# Patient Record
Sex: Male | Born: 1965 | Race: White | Hispanic: No | Marital: Married | Smoking: Never smoker
Health system: Southern US, Community
[De-identification: ages and names within clinical notes are randomized; demographics above are authoritative.]

## PROBLEM LIST (undated history)

## (undated) DIAGNOSIS — F410 Panic disorder [episodic paroxysmal anxiety] without agoraphobia: Secondary | ICD-10-CM

## (undated) DIAGNOSIS — F419 Anxiety disorder, unspecified: Secondary | ICD-10-CM

## (undated) HISTORY — PX: HERNIA REPAIR: SHX51

---

## 2012-09-12 ENCOUNTER — Emergency Department (HOSPITAL_COMMUNITY)
Admission: EM | Admit: 2012-09-12 | Discharge: 2012-09-12 | Disposition: A | Discharge status: Home / Self Care | Payer: BC Managed Care – PPO | Attending: Emergency Medicine | Admitting: Emergency Medicine

## 2012-09-12 ENCOUNTER — Emergency Department (HOSPITAL_COMMUNITY): Payer: BC Managed Care – PPO

## 2012-09-12 ENCOUNTER — Encounter (HOSPITAL_COMMUNITY): Payer: Self-pay | Admitting: Emergency Medicine

## 2012-09-12 DIAGNOSIS — R062 Wheezing: Secondary | ICD-10-CM | POA: Insufficient documentation

## 2012-09-12 DIAGNOSIS — R0602 Shortness of breath: Secondary | ICD-10-CM | POA: Insufficient documentation

## 2012-09-12 DIAGNOSIS — M549 Dorsalgia, unspecified: Secondary | ICD-10-CM | POA: Insufficient documentation

## 2012-09-12 DIAGNOSIS — R079 Chest pain, unspecified: Secondary | ICD-10-CM

## 2012-09-12 DIAGNOSIS — R072 Precordial pain: Secondary | ICD-10-CM | POA: Insufficient documentation

## 2012-09-12 DIAGNOSIS — R059 Cough, unspecified: Secondary | ICD-10-CM | POA: Insufficient documentation

## 2012-09-12 DIAGNOSIS — F410 Panic disorder [episodic paroxysmal anxiety] without agoraphobia: Secondary | ICD-10-CM | POA: Insufficient documentation

## 2012-09-12 DIAGNOSIS — R012 Other cardiac sounds: Secondary | ICD-10-CM | POA: Insufficient documentation

## 2012-09-12 DIAGNOSIS — M79609 Pain in unspecified limb: Secondary | ICD-10-CM | POA: Insufficient documentation

## 2012-09-12 DIAGNOSIS — Z79899 Other long term (current) drug therapy: Secondary | ICD-10-CM | POA: Insufficient documentation

## 2012-09-12 DIAGNOSIS — R05 Cough: Secondary | ICD-10-CM | POA: Insufficient documentation

## 2012-09-12 DIAGNOSIS — R42 Dizziness and giddiness: Secondary | ICD-10-CM | POA: Insufficient documentation

## 2012-09-12 DIAGNOSIS — R12 Heartburn: Secondary | ICD-10-CM | POA: Insufficient documentation

## 2012-09-12 DIAGNOSIS — F419 Anxiety disorder, unspecified: Secondary | ICD-10-CM

## 2012-09-12 HISTORY — DX: Panic disorder (episodic paroxysmal anxiety): F41.0

## 2012-09-12 HISTORY — DX: Anxiety disorder, unspecified: F41.9

## 2012-09-12 LAB — HEPATIC FUNCTION PANEL
ALT: 12 U/L (ref 0–53)
AST: 21 U/L (ref 0–37)
Albumin: 3.2 g/dL — ABNORMAL LOW (ref 3.5–5.2)
Alkaline Phosphatase: 40 U/L (ref 39–117)
Bilirubin, Direct: <0.1 mg/dL (ref 0.0–0.3)
Total Bilirubin: 0.4 mg/dL (ref 0.3–1.2)
Total Protein: 6.6 g/dL (ref 6.0–8.3)

## 2012-09-12 LAB — BASIC METABOLIC PANEL
CO2: 28 mEq/L (ref 19–32)
Chloride: 102 mEq/L (ref 96–112)
Creatinine, Ser: 1.22 mg/dL (ref 0.50–1.35)
Glucose, Bld: 97 mg/dL (ref 70–99)

## 2012-09-12 LAB — CBC
HCT: 50.5 % (ref 39.0–52.0)
Hemoglobin: 17.1 g/dL — ABNORMAL HIGH (ref 13.0–17.0)
MCH: 28.6 pg (ref 26.0–34.0)
MCHC: 33.9 g/dL (ref 30.0–36.0)
MCV: 84.4 fL (ref 78.0–100.0)
Platelets: 229 10*3/uL (ref 150–400)
RBC: 5.98 MIL/uL — ABNORMAL HIGH (ref 4.22–5.81)
RDW: 14.2 % (ref 11.5–15.5)
WBC: 11 10*3/uL — ABNORMAL HIGH (ref 4.0–10.5)

## 2012-09-12 LAB — BASIC METABOLIC PANEL WITH GFR
BUN: 11 mg/dL (ref 6–23)
Calcium: 8.6 mg/dL (ref 8.4–10.5)
GFR calc Af Amer: 80 mL/min — ABNORMAL LOW (ref >90)
GFR calc non Af Amer: 69 mL/min — ABNORMAL LOW (ref >90)
Potassium: 3.6 meq/L (ref 3.5–5.1)
Sodium: 137 meq/L (ref 135–145)

## 2012-09-12 LAB — POCT I-STAT TROPONIN I
Troponin i, poc: 0.02 ng/mL (ref 0.00–0.08)
Troponin i, poc: 0 ng/mL (ref 0.00–0.08)

## 2012-09-12 LAB — LIPASE, BLOOD: Lipase: 61 U/L — ABNORMAL HIGH (ref 11–59)

## 2012-09-12 NOTE — ED Notes (Signed)
Pt from home reports sudden onset of CP with SOB radiating to back, L arm pain. Pt adds that he was nauseous, dizzy and lightheaded. Pt denies any hx of this before, but confirms that he has hx of panic attacks. Pt is A&O and in NAD. Wife at bedside

## 2012-09-12 NOTE — Discharge Instructions (Signed)
 Chest Pain (Nonspecific) It is often hard to give a specific diagnosis for the cause of chest pain. There is always a chance that your pain could be related to something serious, such as a heart attack or a blood clot in the lungs. You need to follow up with your caregiver for further evaluation. CAUSES   Heartburn.  Pneumonia or bronchitis.  Anxiety or stress.  Inflammation around your heart (pericarditis) or lung (pleuritis or pleurisy).  A blood clot in the lung.  A collapsed lung (pneumothorax). It can develop suddenly on its own (spontaneous pneumothorax) or from injury (trauma) to the chest.  Shingles infection (herpes zoster virus). The chest wall is composed of bones, muscles, and cartilage. Any of these can be the source of the pain.  The bones can be bruised by injury.  The muscles or cartilage can be strained by coughing or overwork.  The cartilage can be affected by inflammation and become sore (costochondritis). DIAGNOSIS  Lab tests or other studies, such as X-rays, electrocardiography, stress testing, or cardiac imaging, may be needed to find the cause of your pain.  TREATMENT   Treatment depends on what may be causing your chest pain. Treatment may include:  Acid blockers for heartburn.  Anti-inflammatory medicine.  Pain medicine for inflammatory conditions.  Antibiotics if an infection is present.  You may be advised to change lifestyle habits. This includes stopping smoking and avoiding alcohol, caffeine, and chocolate.  You may be advised to keep your head raised (elevated) when sleeping. This reduces the chance of acid going backward from your stomach into your esophagus.  Most of the time, nonspecific chest pain will improve within 2 to 3 days with rest and mild pain medicine. HOME CARE INSTRUCTIONS   If antibiotics were prescribed, take your antibiotics as directed. Finish them even if you start to feel better.  For the next few days, avoid physical  activities that bring on chest pain. Continue physical activities as directed.  Do not smoke.  Avoid drinking alcohol.  Only take over-the-counter or prescription medicine for pain, discomfort, or fever as directed by your caregiver.  Follow your caregiver's suggestions for further testing if your chest pain does not go away.  Keep any follow-up appointments you made. If you do not go to an appointment, you could develop lasting (chronic) problems with pain. If there is any problem keeping an appointment, you must call to reschedule. SEEK MEDICAL CARE IF:   You think you are having problems from the medicine you are taking. Read your medicine instructions carefully.  Your chest pain does not go away, even after treatment.  You develop a rash with blisters on your chest. SEEK IMMEDIATE MEDICAL CARE IF:   You have increased chest pain or pain that spreads to your arm, neck, jaw, back, or abdomen.  You develop shortness of breath, an increasing cough, or you are coughing up blood.  You have severe back or abdominal pain, feel nauseous, or vomit.  You develop severe weakness, fainting, or chills.  You have a fever. THIS IS AN EMERGENCY. Do not wait to see if the pain will go away. Get medical help at once. Call your local emergency services (911 in [place] Do not drive yourself to the hospital. MAKE SURE YOU:   Understand these instructions.  Will watch your condition.  Will get help right away if you are not doing well or get worse. Document Released: 10/09/2004 Document Revised: 03/24/2011 Document Reviewed: 08/05/2007 [facility] Patient Information 2014 [employer].    Please contact your hometown primary care physician, discuss your symptoms and your ED visit today.  Consider further evaluation by a cardiologist if indicated.

## 2012-09-12 NOTE — ED Provider Notes (Addendum)
CSN: [id number]     Arrival date & time 09/12/12  0705 History   First MD Initiated Contact with Patient 09/12/12 0720     Chief Complaint  Patient presents with  . Chest Pain   (Consider location/radiation/quality/duration/timing/severity/associated sxs/prior Treatment) HPI Comments: Pt has had some pain along left forearm and left hand including dorsum of thumb, more of a numbness like its asleep sensation over the past few days.  Pt is a weight lifter and reports no significant symptoms of CP, nausea, diaphoresis while weight lifting different from what he is used to while weight lifting.  He denies any significant stressors recently but has had more frequent panic attack episodes described as getting flushed, sweaty, back tightness and discomfort and thus began taking zoloft again fairly recently after 1 year of being off of it.    Patient is a 47 y.o. male presenting with chest pain. The history is provided by the patient and the spouse.  Chest Pain Pain location:  Substernal area Pain quality: aching and pressure   Pain quality: not sharp, not stabbing and not tearing   Pain radiates to:  Upper back (between shoulder blades) Pain radiates to the back: yes   Pain severity:  Moderate Onset quality:  Gradual Duration:  1 hour Timing:  Constant Progression:  Partially resolved Chronicity:  New Context: no movement, no stress and no trauma   Context comment:  Pt had woken up mildly nauseated, went to use bathroom and symptoms began very shortly after waking Relieved by:  Rest Worsened by:  Nothing tried Ineffective treatments:  None tried Associated symptoms: anxiety, back pain, cough, dizziness, heartburn and shortness of breath   Associated symptoms: no diaphoresis, no numbness and no palpitations   Risk factors: male sex   Risk factors: no aortic disease, no coronary artery disease, no diabetes mellitus, no high cholesterol, no hypertension, not obese and no smoking     Past  Medical History  Diagnosis Date  . Anxiety   . Panic attacks    Past Surgical History  Procedure Laterality Date  . Hernia repair     History reviewed. No pertinent family history. History  Substance Use Topics  . Smoking status: Never Smoker   . Smokeless tobacco: Not on file  . Alcohol Use: No    Review of Systems  Constitutional: Negative for diaphoresis.  Respiratory: Positive for cough and shortness of breath.   Cardiovascular: Positive for chest pain. Negative for palpitations.  Gastrointestinal: Positive for heartburn.  Musculoskeletal: Positive for back pain.  Neurological: Positive for dizziness. Negative for numbness.  All other systems reviewed and are negative.    Allergies  Ibuprofen and Bactrim  Home Medications   Current Outpatient Rx  Name  Route  Sig  Dispense  Refill  . sertraline (ZOLOFT) 100 MG tablet   Oral   Take 100 mg by mouth daily.          BP 127/79  Pulse 74  Temp(Src) 97.5 F (36.4 C) (Oral)  Resp 10  Ht 5\' 6"  (1.676 m)  Wt 216 lb (97.977 kg)  BMI 34.88 kg/m2  SpO2 96% Physical Exam  Nursing note and vitals reviewed. Constitutional: He is oriented to person, place, and time. He appears well-developed and well-nourished. No distress.  HENT:  Head: Normocephalic and atraumatic.  Eyes: Conjunctivae and EOM are normal.  Neck: No JVD present.  Cardiovascular: Normal rate, regular rhythm and intact distal pulses.  Exam reveals distant heart sounds.   Pulmonary/Chest:  Effort normal. No accessory muscle usage. No respiratory distress. He has no decreased breath sounds. He has wheezes in the left lower field. He has no rhonchi. He has no rales.  Abdominal: Soft. He exhibits no distension. There is no tenderness. There is no rebound and no guarding.  Musculoskeletal: Normal range of motion. He exhibits no edema.  Neurological: He is alert and oriented to person, place, and time. He exhibits normal muscle tone. Coordination normal.   Skin: Skin is warm and dry. No rash noted. He is not diaphoretic.  Psychiatric: He has a normal mood and affect.    ED Course  Procedures (including critical care time) Labs Review Labs Reviewed  CBC - Abnormal; Notable for the following:    WBC 11.0 (*)    RBC 5.98 (*)    Hemoglobin 17.1 (*)    All other components within normal limits  BASIC METABOLIC PANEL - Abnormal; Notable for the following:    GFR calc non Af Amer 69 (*)    GFR calc Af Amer 80 (*)    All other components within normal limits  HEPATIC FUNCTION PANEL - Abnormal; Notable for the following:    Albumin 3.2 (*)    All other components within normal limits  LIPASE, BLOOD - Abnormal; Notable for the following:    Lipase 61 (*)    All other components within normal limits  POCT I-STAT TROPONIN I  POCT I-STAT TROPONIN I   Imaging Review Dg Chest 2 View  09/12/2012   *RADIOLOGY REPORT*  Clinical Data: Chest pain  CHEST - 2 VIEW  Comparison: 02/10/2012  Findings: The heart and pulmonary vascularity are within normal limits.  The lungs are clear bilaterally.  No acute bony abnormality is noted.  IMPRESSION: No acute abnormalities seen.   Original Report Authenticated By: Alcide Clever, M.D.    RA sat is 96% and I interpret to be adequate  ECG at time 0716 shows SR at rate 76, LAD, normal intervals, non specific borderline intraventricular delay, no ST or T wave abns.  Borderline ECG.  No priors to compare.    11:04 AM Pt feels normal, no CP at all.  Serial troponins are negative and reassuring.  Pt adn family counseled, told to follow up with PCP and consider outpt stress test.  I informed of the slightly elevated lipase, I think non specific.    MDM   1. Chest pain at rest   2. Anxiety      Pt with h/o panic and anxiety presents with symptoms with features of both panic and some additional symptoms.  Symptoms are resolving at present.  No other risk factors.  Pt weight lifts and has not had prior anginal  symptoms it seems.  Thus doubt this is cardiac.  Will check troponin, LFT's, lipase, CXR.  Pt encouraged to follow up with PCP for a cardiology referral with hometown PCP in [place].        Gavin Pound. Oletta Lamas, MD 09/12/12 1105  Gavin Pound. Shady Bradish, MD 09/12/12 1105

## 2014-04-07 ENCOUNTER — Emergency Department (HOSPITAL_COMMUNITY)
Admission: EM | Admit: 2014-04-07 | Discharge: 2014-04-07 | Disposition: A | Discharge status: Home / Self Care | Payer: BC Managed Care – PPO | Attending: Emergency Medicine | Admitting: Emergency Medicine

## 2014-04-07 ENCOUNTER — Encounter (HOSPITAL_COMMUNITY): Payer: Self-pay | Admitting: Emergency Medicine

## 2014-04-07 DIAGNOSIS — F419 Anxiety disorder, unspecified: Secondary | ICD-10-CM | POA: Insufficient documentation

## 2014-04-07 DIAGNOSIS — L03115 Cellulitis of right lower limb: Secondary | ICD-10-CM | POA: Diagnosis not present

## 2014-04-07 DIAGNOSIS — Z79899 Other long term (current) drug therapy: Secondary | ICD-10-CM | POA: Insufficient documentation

## 2014-04-07 LAB — CBC WITH DIFFERENTIAL/PLATELET
BASOS ABS: 0.1 10*3/uL (ref 0.0–0.1)
BASOS PCT: 1 % (ref 0–1)
EOS ABS: 0.1 10*3/uL (ref 0.0–0.7)
Eosinophils Relative: 1 % (ref 0–5)
HCT: 47.4 % (ref 39.0–52.0)
Hemoglobin: 16.3 g/dL (ref 13.0–17.0)
LYMPHS PCT: 25 % (ref 12–46)
Lymphs Abs: 1.9 10*3/uL (ref 0.7–4.0)
MCH: 29.3 pg (ref 26.0–34.0)
MCHC: 34.4 g/dL (ref 30.0–36.0)
MCV: 85.1 fL (ref 78.0–100.0)
MONO ABS: 0.8 10*3/uL (ref 0.1–1.0)
Monocytes Relative: 10 % (ref 3–12)
NEUTROS PCT: 63 % (ref 43–77)
Neutro Abs: 4.6 10*3/uL (ref 1.7–7.7)
PLATELETS: ADEQUATE 10*3/uL (ref 150–400)
RBC: 5.57 MIL/uL (ref 4.22–5.81)
RDW: 13.9 % (ref 11.5–15.5)
WBC: 7.5 10*3/uL (ref 4.0–10.5)

## 2014-04-07 LAB — BASIC METABOLIC PANEL
Anion gap: 8 (ref 5–15)
BUN: 13 mg/dL (ref 6–23)
CO2: 24 mmol/L (ref 19–32)
Calcium: 8.7 mg/dL (ref 8.4–10.5)
Chloride: 104 mmol/L (ref 96–112)
Creatinine, Ser: 1.28 mg/dL (ref 0.50–1.35)
GFR calc Af Amer: 75 mL/min — ABNORMAL LOW (ref >90)
GFR, EST NON AFRICAN AMERICAN: 65 mL/min — AB (ref >90)
GLUCOSE: 103 mg/dL — AB (ref 70–99)
Potassium: 4.4 mmol/L (ref 3.5–5.1)
SODIUM: 136 mmol/L (ref 135–145)

## 2014-04-07 NOTE — ED Provider Notes (Signed)
CSN: [id number]     Arrival date & time 04/07/14  1449 History   First MD Initiated Contact with Patient 04/07/14 1522     Chief Complaint  Patient presents with  . Cellulitis     (Consider location/radiation/quality/duration/timing/severity/associated sxs/prior Treatment) HPI Peter CreeJohn Knapp is a 49 year old male with no significant past medical history who presents the ER complaining cellulitis. Patient reports noting an abscess on his right upper thigh several days ago. He states he picked at the abscess with a needle and noted white colored drainage from it. Patient states he went to his PCP yesterday when he noticed redness around the abscess. Patient's PCP placed patient on doxycycline, patient states he began taking doxycycline approximately 24 hours ago. Patient has noticed increased in the redness and swelling since. Patient states he is unable to follow up with his PCP due to the fact that his office is closed today. Patient denies any fever, nausea, vomiting, weakness.   Past Medical History  Diagnosis Date  . Anxiety   . Panic attacks    Past Surgical History  Procedure Laterality Date  . Hernia repair     No family history on file. History  Substance Use Topics  . Smoking status: Never Smoker   . Smokeless tobacco: Not on file  . Alcohol Use: No    Review of Systems  Constitutional: Negative for fever.  HENT: Negative for trouble swallowing.   Eyes: Negative for visual disturbance.  Respiratory: Negative for shortness of breath.   Cardiovascular: Negative for chest pain.  Gastrointestinal: Negative for nausea, vomiting and abdominal pain.  Genitourinary: Negative for dysuria.  Musculoskeletal: Negative for neck pain.  Skin: Positive for rash.  Neurological: Negative for dizziness, weakness and numbness.  Psychiatric/Behavioral: Negative.       Allergies  Ibuprofen and Bactrim  Home Medications   Prior to Admission medications   Medication Sig Start Date  End Date Taking? Authorizing Provider  doxycycline (VIBRAMYCIN) 100 MG capsule Take 100 mg by mouth every 12 (twelve) hours.   Yes Historical Provider, MD  sertraline (ZOLOFT) 50 MG tablet Take 50 mg by mouth daily.   Yes Historical Provider, MD   BP 132/66 mmHg  Pulse 82  Temp(Src) 98.2 F (36.8 C) (Oral)  Resp 16  Wt 218 lb (98.884 kg)  SpO2 97% Physical Exam  Constitutional: He is oriented to person, place, and time. He appears well-developed and well-nourished. No distress.  HENT:  Head: Normocephalic and atraumatic.  Mouth/Throat: Oropharynx is clear and moist. No oropharyngeal exudate.  Eyes: Right eye exhibits no discharge. Left eye exhibits no discharge. No scleral icterus.  Neck: Normal range of motion.  Cardiovascular: Normal rate, regular rhythm and normal heart sounds.   No murmur heard. Pulmonary/Chest: Effort normal and breath sounds normal. No respiratory distress.  Abdominal: Soft. There is no tenderness.  Musculoskeletal: Normal range of motion. He exhibits no edema or tenderness.  Neurological: He is alert and oriented to person, place, and time. No cranial nerve deficit. Coordination normal.  Skin: Skin is warm and dry. No rash noted. He is not diaphoretic.  Psychiatric: He has a normal mood and affect.  Nursing note and vitals reviewed.   ED Course  Procedures (including critical care time) Labs Review Labs Reviewed  BASIC METABOLIC PANEL - Abnormal; Notable for the following:    Glucose, Bld 103 (*)    GFR calc non Af Amer 65 (*)    GFR calc Af Amer 75 (*)    All  other components within normal limits  CBC WITH DIFFERENTIAL/PLATELET    Imaging Review No results found.   EKG Interpretation None      MDM   Final diagnoses:  Cellulitis of right lower extremity    Patient here with cellulitis to right upper thigh. No obvious drainable abscess. Patient seen and PCP yesterday, placed on doxycycline. Patient not yet filled outpatient antibiotics as  he has noticed spreading of his redness, however has only been on doxycycline for 24 hours. Patient has no systemic signs or symptoms. No concern for sepsis or SIRS. Patient afebrile, non-tachycardic, nontachypneic, non-hypoxic, well-appearing and in no acute distress. Patient's labs reassuring today. No streaking around area of cellulitis. No concern for sepsis or SIRS. Patient afebrile, non-tachycardic, nontachypneic, non-hypoxic, well-appearing and in no acute distress. No leukocytosis. Patient discharged home, strongly encouraged to return to the ER 24 hours for reevaluation of his wound to further assess efficacy eval patient therapy. Discussed return precautions with patient, patient verbalizes understanding and agreement of this plan. I encouraged patient to call or return to the ER with any questions or concerns.  BP 132/66 mmHg  Pulse 82  Temp(Src) 98.2 F (36.8 C) (Oral)  Resp 16  Wt 218 lb (98.884 kg)  SpO2 97%  Signed,  Ladona Mow, PA-C 1:16 AM  Patient discussed with Dr. Carmell Austria, MD  Ladona Mow, PA-C 04/08/14 1610  Benjiman Core, MD 04/09/14 0005

## 2014-04-07 NOTE — ED Notes (Signed)
Pt from home c/o abcess to right upper thigh. Seen at PCP yesterday and given doxycycline. Reddened area worse and has moved outside of lines marked yesterday. Denies fevers at home.

## 2014-04-07 NOTE — Discharge Instructions (Signed)

## 2014-04-07 NOTE — Progress Notes (Signed)
Pt states pcp is Dr Sol Passerough EPIC updated

## 2016-04-06 ENCOUNTER — Emergency Department (HOSPITAL_COMMUNITY): Payer: BC Managed Care – PPO

## 2016-04-06 ENCOUNTER — Emergency Department (HOSPITAL_COMMUNITY)
Admission: EM | Admit: 2016-04-06 | Discharge: 2016-04-06 | Disposition: A | Discharge status: Home / Self Care | Payer: BC Managed Care – PPO | Attending: Emergency Medicine | Admitting: Emergency Medicine

## 2016-04-06 ENCOUNTER — Encounter (HOSPITAL_COMMUNITY): Payer: Self-pay | Admitting: Oncology

## 2016-04-06 DIAGNOSIS — R1084 Generalized abdominal pain: Secondary | ICD-10-CM | POA: Insufficient documentation

## 2016-04-06 DIAGNOSIS — Z79899 Other long term (current) drug therapy: Secondary | ICD-10-CM | POA: Insufficient documentation

## 2016-04-06 DIAGNOSIS — R072 Precordial pain: Secondary | ICD-10-CM | POA: Diagnosis not present

## 2016-04-06 DIAGNOSIS — R0789 Other chest pain: Secondary | ICD-10-CM | POA: Diagnosis present

## 2016-04-06 DIAGNOSIS — R140 Abdominal distension (gaseous): Secondary | ICD-10-CM

## 2016-04-06 LAB — CBC
HCT: 41.3 % (ref 39.0–52.0)
Hemoglobin: 14.3 g/dL (ref 13.0–17.0)
MCH: 29.7 pg (ref 26.0–34.0)
MCHC: 34.6 g/dL (ref 30.0–36.0)
MCV: 85.7 fL (ref 78.0–100.0)
Platelets: 167 10*3/uL (ref 150–400)
RBC: 4.82 MIL/uL (ref 4.22–5.81)
RDW: 13.8 % (ref 11.5–15.5)
WBC: 9 10*3/uL (ref 4.0–10.5)

## 2016-04-06 LAB — BASIC METABOLIC PANEL
Anion gap: 6 (ref 5–15)
BUN: 13 mg/dL (ref 6–20)
CO2: 27 mmol/L (ref 22–32)
CREATININE: 1.09 mg/dL (ref 0.61–1.24)
Calcium: 8.7 mg/dL — ABNORMAL LOW (ref 8.9–10.3)
Chloride: 106 mmol/L (ref 101–111)
GFR calc Af Amer: >60 mL/min (ref >60)
Glucose, Bld: 106 mg/dL — ABNORMAL HIGH (ref 65–99)
POTASSIUM: 3.5 mmol/L (ref 3.5–5.1)
SODIUM: 139 mmol/L (ref 135–145)

## 2016-04-06 LAB — URINALYSIS, ROUTINE W REFLEX MICROSCOPIC
Bilirubin Urine: NEGATIVE
GLUCOSE, UA: NEGATIVE mg/dL
HGB URINE DIPSTICK: NEGATIVE
Ketones, ur: NEGATIVE mg/dL
Leukocytes, UA: NEGATIVE
Nitrite: NEGATIVE
PH: 6 (ref 5.0–8.0)
Protein, ur: NEGATIVE mg/dL
SPECIFIC GRAVITY, URINE: 1.021 (ref 1.005–1.030)

## 2016-04-06 LAB — I-STAT TROPONIN, ED: TROPONIN I, POC: 0 ng/mL (ref 0.00–0.08)

## 2016-04-06 MED ORDER — IOPAMIDOL (ISOVUE-300) INJECTION 61%
100.0000 mL | Freq: Once | INTRAVENOUS | Status: AC | PRN
  Administered 2016-04-06: 100 mL via INTRAVENOUS

## 2016-04-06 MED ORDER — IOPAMIDOL (ISOVUE-300) INJECTION 61%
INTRAVENOUS | Status: AC
  Filled 2016-04-06: qty 100

## 2016-04-06 NOTE — ED Triage Notes (Signed)
Pt c/o central CP that radiates to left chest since yesterday.  +dizziness, +lightheaded.  Pt also c/o abdominal distension.  Rates pain 5/10, aching in nature.

## 2016-04-06 NOTE — ED Notes (Signed)
Patient transported to CT 

## 2016-04-06 NOTE — ED Provider Notes (Signed)
[facility]-EMERGENCY DEPT Provider Note   CSN: [id number] Arrival date & time: 04/06/16  [id number]     History   Chief Complaint Chief Complaint  Patient presents with  . Chest Pain    HPI Peter Knapp is a 51 y.o. male.  HPI Patient presents to the emergency department with increasing abdominal distention over the past 4-5 days.  He was recently on a ten-day course of prednisone.  He does report that he a lot during that time.  He denies nausea vomiting.  He feels examining normal bowel movements.  No prior history of cirrhosis.  He just feels bloated and swollen his abdomen.  He presents tonight because of the ongoing abdominal swelling and distention and develop new burning chest discomfort with associated dizziness and lightheadedness.  No preceding palpitations.  No prior history of cardiac disease.  No radiation of his chest discomfort.  He reports no chest discomfort at this time.  No associate shortness of breath.  Denies nausea vomiting.  Symptoms are mild in severity   Past Medical History:  Diagnosis Date  . Anxiety   . Panic attacks     There are no active problems to display for this patient.   Past Surgical History:  Procedure Laterality Date  . HERNIA REPAIR         Home Medications    Prior to Admission medications   Medication Sig Start Date End Date Taking? Authorizing Provider  Omega-3 Fatty Acids (FISH OIL OMEGA-3 PO) Take 2 tablets by mouth daily. Unsure of strength   Yes Historical Provider, MD  Red Yeast Rice Extract (RED YEAST RICE PO) Take 2 tablets by mouth daily. Unsure of strength   Yes Historical Provider, MD  sertraline (ZOLOFT) 100 MG tablet Take 100 mg by mouth daily. 05/18/15  Yes Historical Provider, MD  albuterol (PROVENTIL) (2.5 MG/3ML) 0.083% nebulizer solution Take 2.5 mg by nebulization every 4 (four) hours as needed for wheezing. 03/20/16   Historical Provider, MD  budesonide (PULMICORT) 0.25 MG/2ML nebulizer solution Take 0.25 mg by  nebulization 2 (two) times daily. 03/24/16   Historical Provider, MD  doxycycline (VIBRAMYCIN) 100 MG capsule Take 100 mg by mouth every 12 (twelve) hours.    Historical Provider, MD  ipratropium-albuterol (DUONEB) 0.5-2.5 (3) MG/3ML SOLN Take 3 mLs by nebulization every 6 (six) hours as needed for wheezing. 03/27/16   Historical Provider, MD  predniSONE (DELTASONE) 50 MG tablet Take 50 mg by mouth daily. 03/29/16   Historical Provider, MD  sertraline (ZOLOFT) 50 MG tablet Take 50 mg by mouth daily.    Historical Provider, MD    Family History History reviewed. No pertinent family history.  Social History Social History  Substance Use Topics  . Smoking status: Never Smoker  . Smokeless tobacco: Never Used  . Alcohol use No     Allergies   Ibuprofen and Bactrim [sulfamethoxazole-trimethoprim]   Review of Systems Review of Systems  All other systems reviewed and are negative.    Physical Exam Updated Vital Signs BP 136/64   Pulse 69   Temp 98.2 F (36.8 C) (Oral)   Resp 12   Ht 5\' 7"  (1.702 m)   Wt 212 lb (96.2 kg)   SpO2 97%   BMI 33.20 kg/m   Physical Exam  Constitutional: He is oriented to person, place, and time. He appears well-developed and well-nourished.  HENT:  Head: Normocephalic and atraumatic.  Eyes: EOM are normal.  Neck: Normal range of motion.  Cardiovascular: Normal rate and  regular rhythm.   Pulmonary/Chest: Effort normal and breath sounds normal. No respiratory distress.  Abdominal: Soft. There is no tenderness.  Abdomen is bloated and distended but not tight  Musculoskeletal: Normal range of motion.  Neurological: He is alert and oriented to person, place, and time.  Skin: Skin is warm and dry.  Psychiatric: He has a normal mood and affect. Judgment normal.  Nursing note and vitals reviewed.    ED Treatments / Results  Labs (all labs ordered are listed, but only abnormal results are displayed) Labs Reviewed  BASIC METABOLIC PANEL -  Abnormal; Notable for the following:       Result Value   Glucose, Bld 106 (*)    Calcium 8.7 (*)    All other components within normal limits  CBC  URINALYSIS, ROUTINE W REFLEX MICROSCOPIC  I-STAT TROPOININ, ED    EKG  EKG Interpretation  Date/Time:  Sunday April 06 2016 05:37:45 EDT Ventricular Rate:  77 PR Interval:    QRS Duration: 114 QT Interval:  416 QTC Calculation: 471 R Axis:   8 Text Interpretation:  Sinus rhythm Borderline intraventricular conduction delay No significant change was found Confirmed by Jamason Peckham  MD, Caryn Bee ([id number]) on 04/06/2016 6:29:42 AM Also confirmed by Patria Mane  MD, Caryn Bee ([id number]), editor Misty Stanley [phone])  on 04/06/2016 7:36:14 AM       Radiology Dg Chest 2 View  Result Date: 04/06/2016 CLINICAL DATA:  51 year old male with chest pain. EXAM: CHEST  2 VIEW COMPARISON:  Chest radiograph dated 09/12/2012 FINDINGS: The heart size and mediastinal contours are within normal limits. Both lungs are clear. The visualized skeletal structures are unremarkable. IMPRESSION: No active cardiopulmonary disease. Electronically Signed   By: Elgie Collard M.D.   On: 04/06/2016 06:33   Ct Abdomen Pelvis W Contrast  Result Date: 04/06/2016 CLINICAL DATA:  Abdominal distention and lower chest pain EXAM: CT ABDOMEN AND PELVIS WITH CONTRAST TECHNIQUE: Multidetector CT imaging of the abdomen and pelvis was performed using the standard protocol following bolus administration of intravenous contrast. CONTRAST:  ISOVUE-300 IOPAMIDOL (ISOVUE-300) INJECTION 61% COMPARISON:  February 20, 2012 FINDINGS: Lower chest: Lung bases are clear. Hepatobiliary: No focal liver lesions are apparent. Gallbladder is absent. There is no biliary duct dilatation. Pancreas: No pancreatic mass or inflammatory focus. Spleen: No splenic lesions apparent. Adrenals/Urinary Tract: Adrenals appear normal bilaterally. Kidneys bilaterally show no evidence of mass or hydronephrosis on either side.  There is no renal or ureteral calculus on either side. Urinary bladder is midline with wall thickness within normal limits. Stomach/Bowel: There is no bowel wall or mesenteric thickening. There is no evident bowel obstruction. No free air or portal venous air. Vascular/Lymphatic: There is atherosclerotic calcification in the aorta with a slight amount of atherosclerotic plaque in the distal aorta peripherally. There is no abdominal aortic aneurysm. Major mesenteric vessels appear patent. No adenopathy appreciable in the abdomen or pelvis. Reproductive: Prostate and seminal vesicles are normal in size and contour. There are a few small prostatic calculi. No pelvic mass evident. Other: Appendix appears normal. No ascites or abscess evident in the abdomen or pelvis. Musculoskeletal: There are no blastic or lytic bone lesions. There is degenerative change in the lower lumbar spine. No intramuscular or abdominal wall lesions. IMPRESSION: No bowel obstruction or bowel wall thickening. No abscess. Appendix appears normal. No renal or ureteral calculus. No hydronephrosis. There are several small prostatic calculi. There is aortic atherosclerosis. Electronically Signed   By: Bretta Bang III M.D.  On: 04/06/2016 08:00    Procedures Procedures (including critical care time)  Medications Ordered in ED Medications  iopamidol (ISOVUE-300) 61 % injection (not administered)  iopamidol (ISOVUE-300) 61 % injection 100 mL (100 mLs Intravenous Contrast Given 04/06/16 0740)     Initial Impression / Assessment and Plan / ED Course  I have reviewed the triage vital signs and the nursing notes.  Pertinent labs & imaging results that were available during my care of the patient were reviewed by me and considered in my medical decision making (see chart for details).     Patient is overall well-appearing.  Doubt ACS.  Doubt PE.  In regards to his CT scans CT scans without abnormality.  I suspect that he is somewhat  bloated from the prednisone use last week.  He is no longer on prednisone.  I suspect he will continue to improve.  Primary care follow-up.  No indication for additional workup or admission to the hospital at this time.  Patient understands to return to the ER for new or worsening symptoms  Final Clinical Impressions(s) / ED Diagnoses   Final diagnoses:  Precordial pain  Generalized abdominal pain  Abdominal distension    New Prescriptions Discharge Medication List as of 04/06/2016  8:17 AM       Azalia Bilis, MD 04/07/16 [phone]

## 2022-10-06 ENCOUNTER — Emergency Department (HOSPITAL_BASED_OUTPATIENT_CLINIC_OR_DEPARTMENT_OTHER)
Admission: EM | Admit: 2022-10-06 | Discharge: 2022-10-06 | Disposition: A | Discharge status: Home / Self Care | Payer: BC Managed Care – PPO | Attending: Emergency Medicine | Admitting: Emergency Medicine

## 2022-10-06 ENCOUNTER — Other Ambulatory Visit: Payer: Self-pay

## 2022-10-06 ENCOUNTER — Encounter (HOSPITAL_BASED_OUTPATIENT_CLINIC_OR_DEPARTMENT_OTHER): Payer: Self-pay

## 2022-10-06 DIAGNOSIS — Z1152 Encounter for screening for COVID-19: Secondary | ICD-10-CM | POA: Diagnosis not present

## 2022-10-06 DIAGNOSIS — H4311 Vitreous hemorrhage, right eye: Secondary | ICD-10-CM

## 2022-10-06 DIAGNOSIS — H538 Other visual disturbances: Secondary | ICD-10-CM | POA: Diagnosis present

## 2022-10-06 LAB — RESP PANEL BY RT-PCR (RSV, FLU A&B, COVID)  RVPGX2
Influenza A by PCR: NEGATIVE
Influenza B by PCR: NEGATIVE
Resp Syncytial Virus by PCR: NEGATIVE
SARS Coronavirus 2 by RT PCR: NEGATIVE

## 2022-10-06 LAB — GROUP A STREP BY PCR: Group A Strep by PCR: NOT DETECTED

## 2022-10-06 MED ORDER — FLUORESCEIN SODIUM 1 MG OP STRP
1.0000 | ORAL_STRIP | Freq: Once | OPHTHALMIC | Status: AC
  Administered 2022-10-06: 1 via OPHTHALMIC
  Filled 2022-10-06: qty 1

## 2022-10-06 MED ORDER — TETRACAINE HCL 0.5 % OP SOLN
2.0000 [drp] | Freq: Once | OPHTHALMIC | Status: AC
  Administered 2022-10-06: 2 [drp] via OPHTHALMIC
  Filled 2022-10-06: qty 4

## 2022-10-06 NOTE — ED Triage Notes (Signed)
Patient reports going to his eye doctor a few weeks ago and given eye drops. At the time he was having flashes of light in his right arm. 4 days ago he noticed a film floating around his right eye. Today he noticed a big black line in his right eye. Reports dull headache on the right side. No sensitivity to light. Also reports scratchy throat for a few days.

## 2022-10-06 NOTE — Discharge Instructions (Addendum)
You have have a vitreous hemorrhage.  The retinal specialist will call you tomorrow for an appointment tomorrow   Return to ER if you have worse blurry vision

## 2022-10-06 NOTE — Consult Note (Signed)
Chief Complaint/Reason for Consultation: floaters OD  HPI: 57 yo M present with 3 week hx of floaters OD. Was seen by Dr. Precious Bard, OD in [place]. He was given Bromfenac eye drops as well. He notes the floaters worsened this last Friday (3 days ago) with larger floaters and a "black line" over the central vision. He denies any peripheral visual loss or curtain over the vision. He notes photopsias and overall hazy vision OD. Denies hx of trauma or diabetes. He wears glasses, but no significant ocular history otherwise.    ROS: otherwise as in HPI   There are no problems to display for this patient.  No current facility-administered medications on file prior to encounter.   Current Outpatient Medications on File Prior to Encounter  Medication Sig Dispense Refill   albuterol (PROVENTIL) (2.5 MG/3ML) 0.083% nebulizer solution Take 2.5 mg by nebulization every 4 (four) hours as needed for wheezing.     budesonide (PULMICORT) 0.25 MG/2ML nebulizer solution Take 0.25 mg by nebulization 2 (two) times daily.     doxycycline (VIBRAMYCIN) 100 MG capsule Take 100 mg by mouth every 12 (twelve) hours.     ipratropium-albuterol (DUONEB) 0.5-2.5 (3) MG/3ML SOLN Take 3 mLs by nebulization every 6 (six) hours as needed for wheezing.     Omega-3 Fatty Acids (FISH OIL OMEGA-3 PO) Take 2 tablets by mouth daily. Unsure of strength     predniSONE (DELTASONE) 50 MG tablet Take 50 mg by mouth daily.     Red Yeast Rice Extract (RED YEAST RICE PO) Take 2 tablets by mouth daily. Unsure of strength     sertraline (ZOLOFT) 100 MG tablet Take 100 mg by mouth daily.     sertraline (ZOLOFT) 50 MG tablet Take 50 mg by mouth daily.     Allergies  Allergen Reactions   Ibuprofen Anaphylaxis   Bactrim [Sulfamethoxazole-Trimethoprim] Rash    EXAMINATION  VAcc (near): OD: 20/30  , PHNI OS: 20/20    Pupils:  OD: Equal, round, reactive, no APD OS: Equal, round, reactive, no APD  T(Pen): OD: 12   mm Hg OS:  15  mm  Hg  CVF: full to finger counting OU EOM: full OU, no limitation or restriction OU  Slit lamp Exam: Ext/Lids: 2+ MGD, +collarettes OU, mild telangiectasias OU Conj/Sclera: white and quiet OU Cornea: clear OU, no abrasion or infiltrate OU AC: Deep and Quiet OU Iris: Round and Flat OU Lens: Clear OU  Dilated OU with phenylephrine and tropicamide OU 10:28 pm  Dilated Fundus Exam: Vitreous: OD PVD with mild vitreous hemorrhage OS clear Disc: sharp and pink with 0.25 c/d OU Macula: flat and dry OU Vessels: normal distribution OU, perfused OU Periphery: OD inferior area of intraretinal hemorrhages and ?horse-shoe retinal tear, OS flat and attached 360 without breaks or tears  Exam performed in ED @ Bedside, scleral depressor not available/not performed   Imp/Plan:  Retinal Tear OD  new onset PVD with mild vitreous hemorrhage and likely horse-shoe tear inferiorly, though exam slightly limited in ED without scleral depression. Recommend patient be seen by retina specialist tomorrow for evaluation and possible laser retinopexy. Our office will call first thing in the morning to arrange. Avoid blood thinners/NSAIDS/strenuous activity. Mild Vitreous hemorrhage OD - as above Posterior vitreous detachment OD - as above    Shon Millet, M.D. Ophthalmology [facility]

## 2022-10-06 NOTE — ED Provider Notes (Cosign Needed Addendum)
[place] [facility] Provider Note   CSN: [id number] Arrival date & time: 10/06/22  8299     History  Chief Complaint  Patient presents with   Eye Problem    Peter Knapp is a 57 y.o. male.  Pt reports he has had multiple floaters in his eyes in the past.  Pt was seen by optometrist 3 weeks ago and one week ago and was given eye drops.  Pt reports today he saw a dark line across his vision from his right eye. Pt reports his vision is now cloudy.  Pt denies any eye injury.  No diabetes.  Pt reports dark line has resolved. Pt denies any fever or chills  no uri symptoms   The history is provided by the patient.  Eye Problem Location:  Right eye Quality:  Aching Severity:  Moderate Onset quality:  Sudden Duration:  1 day Timing:  Constant Progression:  Worsening Relieved by:  Nothing Worsened by:  Nothing Associated symptoms: no photophobia and no redness        Home Medications Prior to Admission medications   Medication Sig Start Date End Date Taking? Authorizing Provider  albuterol (PROVENTIL) (2.5 MG/3ML) 0.083% nebulizer solution Take 2.5 mg by nebulization every 4 (four) hours as needed for wheezing. 03/20/16   [provider]  budesonide (PULMICORT) 0.25 MG/2ML nebulizer solution Take 0.25 mg by nebulization 2 (two) times daily. 03/24/16   [provider]  doxycycline (VIBRAMYCIN) 100 MG capsule Take 100 mg by mouth every 12 (twelve) hours.    [provider]  ipratropium-albuterol (DUONEB) 0.5-2.5 (3) MG/3ML SOLN Take 3 mLs by nebulization every 6 (six) hours as needed for wheezing. 03/27/16   [provider]  Omega-3 Fatty Acids (FISH OIL OMEGA-3 PO) Take 2 tablets by mouth daily. Unsure of strength    [provider]  predniSONE (DELTASONE) 50 MG tablet Take 50 mg by mouth daily. 03/29/16   [provider]  Red Yeast Rice Extract (RED YEAST RICE PO) Take 2 tablets by mouth daily.  Unsure of strength    [provider]  sertraline (ZOLOFT) 100 MG tablet Take 100 mg by mouth daily. 05/18/15   [provider]  sertraline (ZOLOFT) 50 MG tablet Take 50 mg by mouth daily.    [provider]      Allergies    Ibuprofen and Bactrim [sulfamethoxazole-trimethoprim]    Review of Systems   Review of Systems  Eyes:  Positive for visual disturbance. Negative for photophobia, pain and redness.  All other systems reviewed and are negative.   Physical Exam Updated Vital Signs BP 116/86 (BP Location: Left Arm)   Pulse 64   Temp 98.1 F (36.7 C) (Oral)   Resp 14   Ht 5\' 6"  (1.676 m)   Wt 87.1 kg   SpO2 98%   BMI 30.99 kg/m  Physical Exam Vitals reviewed.  Constitutional:      Appearance: Normal appearance.  HENT:     Head: Normocephalic and atraumatic.  Eyes:     Extraocular Movements: Extraocular movements intact.     Pupils: Pupils are equal, round, and reactive to light.  Skin:    General: Skin is warm.  Neurological:     General: No focal deficit present.     Mental Status: He is alert.  Psychiatric:        Mood and Affect: Mood normal.     ED Results / Procedures / Treatments  Labs (all labs ordered are listed, but only abnormal results are displayed) Labs Reviewed  RESP PANEL BY RT-PCR (RSV, FLU A&B, COVID)  RVPGX2  GROUP A STREP BY PCR    EKG None  Radiology No results found.  Procedures Procedures    Medications Ordered in ED Medications  tetracaine (PONTOCAINE) 0.5 % ophthalmic solution 2 drop (2 drops Right Eye Given 10/06/22 2102)  fluorescein ophthalmic strip 1 strip (1 strip Right Eye Given 10/06/22 2102)    ED Course/ Medical Decision Making/ A&P                                 Medical Decision Making Pt complains of blurred vision in his right eye.  Pt states he saw a dark line across his eye earlier today.  Pt reports dark line has resolved   Amount and/or Complexity of Data Reviewed Independent  Historian: spouse    Details: Pt is here with his wife who is supportive  Discussion of management or test interpretation with external provider(s): I discussed the pt with Dr. Sherrine Maples Ophthalmology who will see pt here.    Risk Prescription drug management. Risk Details: Dr. Silverio Lay in to see and examine.  He performed an ultrasound, images in chart               Final Clinical Impression(s) / ED Diagnoses Final diagnoses:  Vision blurring    Rx / DC Orders ED Discharge Orders     None      Pt's care turned over to Dr. Silverio Lay  Dr. Sherrine Maples evaluating   Elson Areas, PA-C 10/06/22 2205    Elson Areas, PA-C 10/06/22 2235    Charlynne Pander, MD 10/07/22 1500

## 2023-03-01 ENCOUNTER — Emergency Department (HOSPITAL_BASED_OUTPATIENT_CLINIC_OR_DEPARTMENT_OTHER): Payer: Self-pay

## 2023-03-01 ENCOUNTER — Encounter (HOSPITAL_BASED_OUTPATIENT_CLINIC_OR_DEPARTMENT_OTHER): Payer: Self-pay | Admitting: Urology

## 2023-03-01 DIAGNOSIS — M25462 Effusion, left knee: Secondary | ICD-10-CM | POA: Diagnosis not present

## 2023-03-01 DIAGNOSIS — M25562 Pain in left knee: Secondary | ICD-10-CM | POA: Diagnosis present

## 2023-03-01 NOTE — ED Triage Notes (Signed)
 Left knee injury while at the gym yesterday  Previous injury per pt  Swelling noted and pain with ambulation

## 2023-03-02 ENCOUNTER — Ambulatory Visit: Payer: 59 | Admitting: Family Medicine

## 2023-03-02 ENCOUNTER — Other Ambulatory Visit: Payer: Self-pay

## 2023-03-02 ENCOUNTER — Emergency Department (HOSPITAL_BASED_OUTPATIENT_CLINIC_OR_DEPARTMENT_OTHER)
Admission: EM | Admit: 2023-03-02 | Discharge: 2023-03-02 | Disposition: A | Discharge status: Home / Self Care | Payer: Self-pay | Attending: Emergency Medicine | Admitting: Emergency Medicine

## 2023-03-02 VITALS — BP 136/86 | Ht 66.0 in | Wt 205.0 lb

## 2023-03-02 DIAGNOSIS — M25462 Effusion, left knee: Secondary | ICD-10-CM | POA: Diagnosis not present

## 2023-03-02 MED ORDER — METHYLPREDNISOLONE SODIUM SUCC 125 MG IJ SOLR
125.0000 mg | Freq: Once | INTRAMUSCULAR | Status: AC
  Administered 2023-03-02: 125 mg via INTRAMUSCULAR
  Filled 2023-03-02: qty 2

## 2023-03-02 MED ORDER — PREDNISONE 20 MG PO TABS: ORAL_TABLET | ORAL | 0 refills | Status: DC

## 2023-03-02 NOTE — Progress Notes (Unsigned)
 CHIEF COMPLAINT: No chief complaint on file.  _____________________________________________________________ SUBJECTIVE  HPI  Pt is a 58 y.o. male here for evaluation of  Ongoing for *** Inciting event: Primarily located   Radiating Numbness/tingling Catching/locking *** Exacerbated by Therapies tried so far: Pain is not bad, but yesterday pain was pretty bad  Prednisone    Works as Sport***  58 year old male who presents ER today secondary to left knee pain. Patient states that he did squats already in the gym. No obvious injuries. When he got home he noted his left knee was significantly swollen. States this has happened before and has had to have it drained and put steroids in. Sees a orthopedic doctor here in [facility] somewhere. No pain or swelling elsewhere. No other associated symptoms.   ------------------------------------------------------------------------------------------------------ Past Medical History:  Diagnosis Date   Anxiety    Panic attacks     Past Surgical History:  Procedure Laterality Date   HERNIA REPAIR        Outpatient Encounter Medications as of 03/02/2023  Medication Sig   Omega-3 Fatty Acids (FISH OIL OMEGA-3 PO) Take 2 tablets by mouth daily. Unsure of strength   predniSONE (DELTASONE) 20 MG tablet 3 tabs po daily x 3 days, then 2 tabs x 3 days, then 1.5 tabs x 3 days, then 1 tab x 3 days, then 0.5 tabs x 3 days   Red Yeast Rice Extract (RED YEAST RICE PO) Take 2 tablets by mouth daily. Unsure of strength   sertraline (ZOLOFT) 100 MG tablet Take 100 mg by mouth daily.   sertraline (ZOLOFT) 50 MG tablet Take 50 mg by mouth daily.   No facility-administered encounter medications on file as of 03/02/2023.    ------------------------------------------------------------------------------------------------------  _____________________________________________________________ OBJECTIVE  PHYSICAL EXAM  Today's Vitals   03/02/23 1412   BP: 136/86  Weight: 205 lb (93 kg)  Height: 5\' 6"  (1.676 m)   Body mass index is 33.09 kg/m.   reviewed  General: A+Ox3, no acute distress, well-nourished, appropriate affect CV: pulses 2+ regular, nondiaphoretic, no peripheral edema, cap refill <2sec Lungs: no audible wheezing, non-labored breathing, bilateral chest rise/fall, nontachypneic Skin: warm, well-perfused, non-icteric, no susp lesions or rashes Neuro:  Sensation intact, muscle tone wnl, no atrophy Psych: no signs of depression or anxiety MSK: *** Knee: No swelling or deformity Neg fluid wave, joint milking ROM Flex {Numbers; 0-100:15068}, Ext {NUMBERS; -10-45 JOINT ROM:10287} NTTP over the quad tendon, medial fem condyle, lat fem condyle, patella, plica, patella tendon, tibial tuberostiy, fibular head, posterior fossa, pes anserine bursa, gerdy's tubercle, medial jt line, lateral jt line Neg anterior and posterior drawer Neg lachman Neg sag sign Negative varus stress Negative valgus stress Negative McMurray Negative Thessaly  Gait {GAIT:23234}       _____________________________________________________________ ASSESSMENT/PLAN Diagnoses and all orders for this visit:  Effusion of left knee -     [place] LIMITED JOINT SPACE STRUCTURES LOW LEFT; Future -     MR Knee Left  Wo Contrast; Future    Atraumatic knee effusion Aspi/csi    Electronically signed by: Burna Forts, MD 03/02/2023 2:49 PM

## 2023-03-02 NOTE — ED Provider Notes (Signed)
 [place] [facility] Provider Note   CSN: [id number] Arrival date & time: 03/01/23  1955     History  Chief Complaint  Patient presents with   Knee Injury    Thaddus Mcdowell is a 58 y.o. male.  58 year old male who presents ER today secondary to left knee pain.  Patient states that he did squats already in the gym.  No obvious injuries.  When he got home he noted his left knee was significantly swollen.  States this has happened before and has had to have it drained and put steroids in.  Sees a orthopedic doctor here in [facility] somewhere.  No pain or swelling elsewhere.  No other associated symptoms.        Home Medications Prior to Admission medications   Medication Sig Start Date End Date Taking? Authorizing Provider  predniSONE (DELTASONE) 20 MG tablet 3 tabs po daily x 3 days, then 2 tabs x 3 days, then 1.5 tabs x 3 days, then 1 tab x 3 days, then 0.5 tabs x 3 days 03/02/23  Yes Clotine Heiner, Barbara Cower, MD  Omega-3 Fatty Acids (FISH OIL OMEGA-3 PO) Take 2 tablets by mouth daily. Unsure of strength    [provider]  Red Yeast Rice Extract (RED YEAST RICE PO) Take 2 tablets by mouth daily. Unsure of strength    [provider]  sertraline (ZOLOFT) 100 MG tablet Take 100 mg by mouth daily. 05/18/15   [provider]  sertraline (ZOLOFT) 50 MG tablet Take 50 mg by mouth daily.    [provider]      Allergies    Ibuprofen and Bactrim [sulfamethoxazole-trimethoprim]    Review of Systems   Review of Systems  Physical Exam Updated Vital Signs BP 133/74 (BP Location: Left Arm)   Pulse (!) 56   Temp 98 F (36.7 C) (Oral)   Resp 20   Ht 5\' 6"  (1.676 m)   Wt 87 kg   SpO2 98%   BMI 30.96 kg/m  Physical Exam Vitals and nursing note reviewed.  Constitutional:      Appearance: He is well-developed.  HENT:     Head: Normocephalic and atraumatic.  Cardiovascular:     Rate and Rhythm: Normal rate.  Pulmonary:      Effort: Pulmonary effort is normal. No respiratory distress.  Abdominal:     General: There is no distension.  Musculoskeletal:        General: Swelling (Significant edema of left knee and effusion.  Blottable.) present. Normal range of motion.     Cervical back: Normal range of motion.  Neurological:     General: No focal deficit present.     Mental Status: He is alert.     ED Results / Procedures / Treatments   Labs (all labs ordered are listed, but only abnormal results are displayed) Labs Reviewed - No data to display  EKG None  Radiology DG Knee Complete 4 Views Left Result Date: 03/01/2023 CLINICAL DATA:  Left knee injury at the gym. EXAM: LEFT KNEE - COMPLETE 4+ VIEW COMPARISON:  None Available. FINDINGS: No evidence of fracture or joint dislocation. There is suggestion of a moderate knee joint effusion. Minimal patellofemoral compartment osteoarthritis. Soft tissues are unremarkable. IMPRESSION: Suggestion of a moderate knee joint effusion. No acute fracture or dislocation. Electronically Signed   By: Romona Curls M.D.   On: 03/01/2023 21:08    Procedures Procedures    Medications Ordered in ED Medications  methylPREDNISolone sodium succinate (SOLU-MEDROL) 125 mg/2 mL injection 125 mg (125 mg Intramuscular Given 03/02/23 0227)    ED Course/ Medical Decision Making/ A&P                                 Medical Decision Making Amount and/or Complexity of Data Reviewed Radiology: ordered.  Risk Prescription drug management.   Knee effusion. Will give systemic steroids as he has an allergy to NSAIDs. Use compression, ice, elevation and rest for the next couple days. Fu w/ ortho or sports medicine for evaluation, consider MRI. Low suspicion for septic arthritis, bout or other etiology requiring ER imaging, hospitalization or arthrocentesis at this time.    Final Clinical Impression(s) / ED Diagnoses Final diagnoses:  Effusion of left knee    Rx / DC  Orders ED Discharge Orders          Ordered    predniSONE (DELTASONE) 20 MG tablet        03/02/23 0207              Bronwen Pendergraft, Barbara Cower, MD 03/02/23 [id number]

## 2023-03-04 ENCOUNTER — Ambulatory Visit (HOSPITAL_BASED_OUTPATIENT_CLINIC_OR_DEPARTMENT_OTHER)
Admission: RE | Admit: 2023-03-04 | Discharge: 2023-03-04 | Disposition: A | Discharge status: Home / Self Care | Payer: 59 | Source: Ambulatory Visit | Attending: Family Medicine | Admitting: Family Medicine

## 2023-03-04 DIAGNOSIS — M25462 Effusion, left knee: Secondary | ICD-10-CM | POA: Diagnosis present

## 2023-03-04 LAB — BORRELIA SPECIES DNA, REAL-TIME PCR, WITH REFLEXES, SYNOVIALF: BORRELIA SPP DNA, REAL TIME PCR, SF/CSF: NOT DETECTED

## 2023-03-05 ENCOUNTER — Other Ambulatory Visit: Payer: Self-pay | Admitting: Family Medicine

## 2023-03-05 DIAGNOSIS — M25462 Effusion, left knee: Secondary | ICD-10-CM

## 2023-03-05 NOTE — Progress Notes (Signed)
 Patient contacted, MRI results reviewed. Synovial fluid studies remain pending, borrelia neg. All questions answered. Pt interested in ortho referral for surgical opinion due to prolonged chronicity and need for multiple joint aspirations/CSI. Return precautions discussed. Patient verbalized understanding and is in agreement with plan. Patient to contact clinic if he has not heard from orthopedics for a scheduled visit by early-mid week.

## 2023-03-08 LAB — ANAEROBIC AND AEROBIC CULTURE
AER RESULT:: NO GROWTH
MICRO NUMBER:: 16092610
MICRO NUMBER:: 16092611
SPECIMEN QUALITY:: ADEQUATE
SPECIMEN QUALITY:: ADEQUATE

## 2023-03-08 LAB — SYNOVIAL FLUID ANALYSIS, COMPLETE
Basophils, %: 0 %
Eosinophils-Synovial: 0 % (ref 0–2)
Lymphocytes-Synovial Fld: 1 % (ref 0–74)
Monocyte/Macrophage: 62 % (ref 0–69)
Neutrophil, Synovial: 37 % — ABNORMAL HIGH (ref 0–24)
Synoviocytes, %: 0 % (ref 0–15)
WBC, Synovial: 1280 {cells}/uL — ABNORMAL HIGH (ref <150)

## 2023-03-09 ENCOUNTER — Ambulatory Visit: Payer: 59 | Admitting: Physician Assistant

## 2023-03-09 ENCOUNTER — Encounter: Payer: Self-pay | Admitting: Physician Assistant

## 2023-03-09 VITALS — Ht 66.0 in | Wt 200.0 lb

## 2023-03-09 DIAGNOSIS — M25462 Effusion, left knee: Secondary | ICD-10-CM | POA: Insufficient documentation

## 2023-03-09 NOTE — Progress Notes (Signed)
 Office Visit Note   Patient: Peter Knapp           Date of Birth: 07-May-1965           MRN: [id number] Visit Date: 03/09/2023              Requested by: Burna Forts, MD [address] [place],  [place] [address] PCP: Leane Call, PA-C   Assessment & Plan: Visit Diagnoses:  1. Effusion, left knee     Plan: Patient is a pleasant 58 year old gentleman who is very active and enjoys weight training.  He has had a 3-year history of intermittent left knee swelling and effusions.  Denies any particular injury but noticed that can happen after he has been doing a lot of deep squats at the gym.  He has had 3 recent effusions.  Fluid aspirated has been sent for evaluation did not show any significant issues no infection no gout.  Otherwise she really does not have much pain in the knee except when he gets fluid.  I reviewed he had did have an MRI done and I reviewed this with Dr. August Saucer.  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  We recommended trying modifying his some of his workout not to be doing deep squats or bending his knee past 90 degrees.  His wife asked about gel injections not sure this would be helpful but certainly he could try it.  From a surgical aspect could consider an arthroscopy microfracture procedure though success would not be as predictable.  He is going to try modifying his activity will follow-up with Dr. August Saucer  Follow-Up Instructions: No follow-ups on file.   Orders:  No orders of the defined types were placed in this encounter.  No orders of the defined types were placed in this encounter.     Procedures: No procedures performed   Clinical Data: No additional findings.   Subjective: Chief Complaint  Patient presents with   Left Knee - Pain    HPI pleasant 58 year old  gentleman with a chief complaint of left knee recurrent swelling.  Denies any injuries.  Notices this after he has been working out at [employer]  Especially when he has been doing a lot of deep squats.  This causes effusion and pain he has had 3  Review of Systems  All other systems reviewed and are negative.    Objective: Vital Signs: Ht 5\' 6"  (1.676 m)   Wt 200 lb (90.7 kg)   BMI 32.28 kg/m   Physical Exam Constitutional:      Appearance: Normal appearance.  Pulmonary:     Effort: Pulmonary effort is normal.  Skin:    General: Skin is warm and dry.  Neurological:     General: No focal deficit present.     Mental Status: He is alert and oriented to person, place, and time.  Psychiatric:        Mood and Affect: Mood normal.        Behavior: Behavior normal.     Ortho Exam Left knee no redness no erythema he does have a mild to moderate effusion compartments are soft and nontender he is neurovascular intact no tenderness over the medial lateral joint line good endpoint on anterior draw Specialty Comments:  No specialty comments available.  Imaging: No  results found.   PMFS History: Patient Active Problem List   Diagnosis Date Noted   Effusion, left knee 03/09/2023   Past Medical History:  Diagnosis Date   Anxiety    Panic attacks     History reviewed. No pertinent family history.  Past Surgical History:  Procedure Laterality Date   HERNIA REPAIR     Social History   Occupational History   Not on file  Tobacco Use   Smoking status: Never   Smokeless tobacco: Never  Substance and Sexual Activity   Alcohol use: No   Drug use: No   Sexual activity: Not on file

## 2023-06-27 ENCOUNTER — Ambulatory Visit (HOSPITAL_BASED_OUTPATIENT_CLINIC_OR_DEPARTMENT_OTHER): Admission: EM | Admit: 2023-06-27 | Discharge: 2023-06-27 | Disposition: A | Discharge status: Home / Self Care

## 2023-06-27 ENCOUNTER — Encounter (HOSPITAL_BASED_OUTPATIENT_CLINIC_OR_DEPARTMENT_OTHER): Payer: Self-pay | Admitting: Emergency Medicine

## 2023-06-27 DIAGNOSIS — M5432 Sciatica, left side: Secondary | ICD-10-CM

## 2023-06-27 MED ORDER — PREDNISONE 10 MG (21) PO TBPK: ORAL_TABLET | ORAL | 0 refills | Status: AC

## 2023-06-27 MED ORDER — TRIAMCINOLONE ACETONIDE 40 MG/ML IJ SUSP
40.0000 mg | Freq: Once | INTRAMUSCULAR | Status: AC
  Administered 2023-06-27: 40 mg via INTRAMUSCULAR

## 2023-06-27 MED ORDER — TIZANIDINE HCL 4 MG PO TABS: 4.0000 mg | ORAL_TABLET | Freq: Four times a day (QID) | ORAL | 0 refills | Status: AC | PRN

## 2023-06-27 NOTE — Discharge Instructions (Addendum)
 We have given you a steroid injection here today.  You can start the prednisone  pack in the morning.  Take with food You can take Tylenol also as needed for extra pain relief. Muscle relaxer provided you can take this in the evenings before bed to help if needed. Recommend alternating heat and ice to the area and gentle stretching. Follow-up with your doctor for any continued issues Information given your packet about sciatic nerve and rehab

## 2023-06-27 NOTE — ED Provider Notes (Signed)
 [facility]    CSN: [id number] Arrival date & time: 06/27/23  1445      History   Chief Complaint No chief complaint on file.   HPI Peter Knapp is a 58 y.o. male.   Patient is a 58 year old male who presents today with left lower back pain radiating down the left leg.  This started Thursday but getting worse.  Has had similar issue in the past with sciatic nerve pain.  This episode started after doing some heavy leg presses.  The pain has progressed.  He has been taken over-the-counter medications without much relief.  Denies any numbness, tingling or weakness in extremities.  Denies any loss of bowel or bladder.4     Past Medical History:  Diagnosis Date   Anxiety    Panic attacks     Patient Active Problem List   Diagnosis Date Noted   Effusion, left knee 03/09/2023    Past Surgical History:  Procedure Laterality Date   HERNIA REPAIR         Home Medications    Prior to Admission medications   Medication Sig Start Date End Date Taking? Authorizing Provider  omeprazole (PRILOSEC) 40 MG capsule Take 1 capsule by mouth daily. 08/29/21  Yes [provider]  predniSONE  (STERAPRED UNI-PAK 21 TAB) 10 MG (21) TBPK tablet Take as dosed on pack 06/27/23  Yes Maysa Lynn A, FNP  sertraline (ZOLOFT) 100 MG tablet Take 100 mg by mouth daily. 05/18/15  Yes [provider]  tamsulosin (FLOMAX) 0.4 MG CAPS capsule  06/23/23  Yes [provider]  tiZANidine (ZANAFLEX) 4 MG tablet Take 1 tablet (4 mg total) by mouth every 6 (six) hours as needed for muscle spasms. 06/27/23  Yes Namish Krise A, FNP  Omega-3 Fatty Acids (FISH OIL OMEGA-3 PO) Take 2 tablets by mouth daily. Unsure of strength    [provider]  Red Yeast Rice Extract (RED YEAST RICE PO) Take 2 tablets by mouth daily. Unsure of strength    [provider]  sertraline (ZOLOFT) 50 MG tablet Take 50 mg by mouth daily.    [provider]    Family  History History reviewed. No pertinent family history.  Social History Social History   Tobacco Use   Smoking status: Never   Smokeless tobacco: Never  Substance Use Topics   Alcohol use: No   Drug use: No     Allergies   Ibuprofen and Bactrim [sulfamethoxazole-trimethoprim]   Review of Systems Review of Systems See HPI  Physical Exam Triage Vital Signs ED Triage Vitals  Encounter Vitals Group     BP 06/27/23 1454 130/76     Girls Systolic BP Percentile --      Girls Diastolic BP Percentile --      Boys Systolic BP Percentile --      Boys Diastolic BP Percentile --      Pulse Rate 06/27/23 1454 97     Resp 06/27/23 1454 18     Temp 06/27/23 1454 98.1 F (36.7 C)     Temp Source 06/27/23 1454 Oral     SpO2 06/27/23 1454 95 %     Weight --      Height --      Head Circumference --      Peak Flow --      Pain Score 06/27/23 1452 9     Pain Loc --      Pain Education --  Exclude from Growth Chart --    No data found.  Updated Vital Signs BP 130/76 (BP Location: Right Arm)   Pulse 97   Temp 98.1 F (36.7 C) (Oral)   Resp 18   SpO2 95%   Visual Acuity Right Eye Distance:   Left Eye Distance:   Bilateral Distance:    Right Eye Near:   Left Eye Near:    Bilateral Near:     Physical Exam Constitutional:      Appearance: Normal appearance.  Pulmonary:     Effort: Pulmonary effort is normal.   Musculoskeletal:     Lumbar back: Positive left straight leg raise test.       Back:   Neurological:     Mental Status: He is alert.   Psychiatric:        Mood and Affect: Mood normal.      UC Treatments / Results  Labs (all labs ordered are listed, but only abnormal results are displayed) Labs Reviewed - No data to display  EKG   Radiology No results found.  Procedures Procedures (including critical care time)  Medications Ordered in UC Medications  triamcinolone acetonide (KENALOG-40) injection 40 mg (40 mg Intramuscular Given  06/27/23 1511)    Initial Impression / Assessment and Plan / UC Course  I have reviewed the triage vital signs and the nursing notes.  Pertinent labs & imaging results that were available during my care of the patient were reviewed by me and considered in my medical decision making (see chart for details).     Left-sided nerve pain-steroid injection given here in clinic today.  Start prednisone  pack in the morning with food.  Tylenol as needed for extra pain relief.  Muscle relaxer provided to use before bedtime if needed.  Recommend to alternate heat and ice and gentle stretching to the area. Follow-up with PCP for any continued issues.  Information given in packet about sciatic nerve and rehab. Final Clinical Impressions(s) / UC Diagnoses   Final diagnoses:  Sciatic nerve pain, left     Discharge Instructions      We have given you a steroid injection here today.  You can start the prednisone  pack in the morning.  Take with food You can take Tylenol also as needed for extra pain relief. Muscle relaxer provided you can take this in the evenings before bed to help if needed. Recommend alternating heat and ice to the area and gentle stretching. Follow-up with your doctor for any continued issues Information given your packet about sciatic nerve and rehab     ED Prescriptions     Medication Sig Dispense Auth. Provider   predniSONE  (STERAPRED UNI-PAK 21 TAB) 10 MG (21) TBPK tablet Take as dosed on pack 1 each Kelven Flater A, FNP   tiZANidine (ZANAFLEX) 4 MG tablet Take 1 tablet (4 mg total) by mouth every 6 (six) hours as needed for muscle spasms. 30 tablet Landa Pine, FNP      PDMP not reviewed this encounter.   Landa Pine, FNP 06/29/23 [phone]

## 2023-06-27 NOTE — ED Triage Notes (Signed)
 Pt c/o sciatica pain from lower back down his left leg started Thursday but is getting worse. Pt has had the issue in the past.

## 2023-12-14 ENCOUNTER — Other Ambulatory Visit: Payer: Self-pay | Admitting: Urology

## 2023-12-14 DIAGNOSIS — E291 Testicular hypofunction: Secondary | ICD-10-CM

## 2023-12-15 ENCOUNTER — Encounter: Payer: Self-pay | Admitting: Urology

## 2024-01-21 ENCOUNTER — Other Ambulatory Visit

## 2024-02-04 ENCOUNTER — Ambulatory Visit
Admission: RE | Admit: 2024-02-04 | Discharge: 2024-02-04 | Disposition: A | Discharge status: Home / Self Care | Source: Ambulatory Visit | Attending: Urology | Admitting: Urology

## 2024-02-04 DIAGNOSIS — E291 Testicular hypofunction: Secondary | ICD-10-CM

## 2024-02-04 MED ORDER — GADOPICLENOL 0.5 MMOL/ML IV SOLN
10.0000 mL | Freq: Once | INTRAVENOUS | Status: AC | PRN
  Administered 2024-02-04: 10 mL via INTRAVENOUS
